# Patient Record
Sex: Male | Born: 1950 | Race: White | Hispanic: No | Marital: Married | Smoking: Current every day smoker
Health system: Southern US, Community
[De-identification: ages and names within clinical notes are randomized; demographics above are authoritative.]

## PROBLEM LIST (undated history)

## (undated) ENCOUNTER — Emergency Department (HOSPITAL_COMMUNITY): Payer: Medicare Other

## (undated) DIAGNOSIS — I10 Essential (primary) hypertension: Secondary | ICD-10-CM

## (undated) HISTORY — PX: BACK SURGERY: SHX140

## (undated) HISTORY — PX: JOINT REPLACEMENT: SHX530

## (undated) HISTORY — PX: NECK SURGERY: SHX720

---

## 2011-09-15 ENCOUNTER — Encounter (HOSPITAL_BASED_OUTPATIENT_CLINIC_OR_DEPARTMENT_OTHER): Payer: Self-pay | Admitting: Emergency Medicine

## 2011-09-15 ENCOUNTER — Emergency Department (INDEPENDENT_AMBULATORY_CARE_PROVIDER_SITE_OTHER): Payer: Self-pay

## 2011-09-15 ENCOUNTER — Emergency Department (HOSPITAL_BASED_OUTPATIENT_CLINIC_OR_DEPARTMENT_OTHER)
Admission: EM | Admit: 2011-09-15 | Discharge: 2011-09-16 | Disposition: A | Discharge status: Home / Self Care | Payer: Self-pay | Attending: Emergency Medicine | Admitting: Emergency Medicine

## 2011-09-15 DIAGNOSIS — J189 Pneumonia, unspecified organism: Secondary | ICD-10-CM | POA: Insufficient documentation

## 2011-09-15 DIAGNOSIS — R918 Other nonspecific abnormal finding of lung field: Secondary | ICD-10-CM

## 2011-09-15 DIAGNOSIS — J029 Acute pharyngitis, unspecified: Secondary | ICD-10-CM

## 2011-09-15 DIAGNOSIS — I10 Essential (primary) hypertension: Secondary | ICD-10-CM | POA: Insufficient documentation

## 2011-09-15 DIAGNOSIS — B370 Candidal stomatitis: Secondary | ICD-10-CM | POA: Insufficient documentation

## 2011-09-15 DIAGNOSIS — R05 Cough: Secondary | ICD-10-CM

## 2011-09-15 DIAGNOSIS — F17200 Nicotine dependence, unspecified, uncomplicated: Secondary | ICD-10-CM | POA: Insufficient documentation

## 2011-09-15 DIAGNOSIS — D3500 Benign neoplasm of unspecified adrenal gland: Secondary | ICD-10-CM

## 2011-09-15 HISTORY — DX: Essential (primary) hypertension: I10

## 2011-09-15 LAB — DIFFERENTIAL
Basophils Absolute: 0 10*3/uL (ref 0.0–0.1)
Lymphocytes Relative: 7 % — ABNORMAL LOW (ref 12–46)
Lymphs Abs: 1.3 10*3/uL (ref 0.7–4.0)
Monocytes Absolute: 1.4 10*3/uL — ABNORMAL HIGH (ref 0.1–1.0)
Monocytes Relative: 8 % (ref 3–12)
Neutro Abs: 15.6 10*3/uL — ABNORMAL HIGH (ref 1.7–7.7)

## 2011-09-15 LAB — COMPREHENSIVE METABOLIC PANEL
AST: 9 U/L (ref 0–37)
CO2: 28 mEq/L (ref 19–32)
Chloride: 99 mEq/L (ref 96–112)
Creatinine, Ser: 1.7 mg/dL — ABNORMAL HIGH (ref 0.50–1.35)
GFR calc non Af Amer: 42 mL/min — ABNORMAL LOW (ref >90)
Glucose, Bld: 100 mg/dL — ABNORMAL HIGH (ref 70–99)
Total Bilirubin: 0.3 mg/dL (ref 0.3–1.2)

## 2011-09-15 LAB — CBC
HCT: 43.1 % (ref 39.0–52.0)
Hemoglobin: 14.2 g/dL (ref 13.0–17.0)
RBC: 4.82 MIL/uL (ref 4.22–5.81)
RDW: 14.7 % (ref 11.5–15.5)
WBC: 18.4 10*3/uL — ABNORMAL HIGH (ref 4.0–10.5)

## 2011-09-15 LAB — RAPID STREP SCREEN (MED CTR MEBANE ONLY): Streptococcus, Group A Screen (Direct): NEGATIVE

## 2011-09-15 MED ORDER — DEXTROSE 5 % IV SOLN
500.0000 mg | Freq: Once | INTRAVENOUS | Status: AC
  Administered 2011-09-15: 500 mg via INTRAVENOUS
  Filled 2011-09-15: qty 500

## 2011-09-15 MED ORDER — MOXIFLOXACIN HCL 400 MG PO TABS: 400.0000 mg | ORAL_TABLET | Freq: Every day | ORAL | Status: DC

## 2011-09-15 MED ORDER — MOXIFLOXACIN HCL 400 MG PO TABS: 400.0000 mg | ORAL_TABLET | Freq: Every day | ORAL | Status: AC

## 2011-09-15 MED ORDER — FLUCONAZOLE 200 MG PO TABS: 200.0000 mg | ORAL_TABLET | Freq: Every day | ORAL | Status: DC

## 2011-09-15 MED ORDER — SODIUM CHLORIDE 0.9 % IV BOLUS (SEPSIS)
1000.0000 mL | Freq: Once | INTRAVENOUS | Status: AC
  Administered 2011-09-15: 1000 mL via INTRAVENOUS

## 2011-09-15 MED ORDER — IOHEXOL 300 MG/ML  SOLN
50.0000 mL | Freq: Once | INTRAMUSCULAR | Status: AC | PRN
  Administered 2011-09-15: 50 mL via INTRAVENOUS

## 2011-09-15 MED ORDER — SODIUM CHLORIDE 0.9 % IV SOLN
Freq: Once | INTRAVENOUS | Status: AC
  Administered 2011-09-15: 22:00:00 via INTRAVENOUS

## 2011-09-15 MED ORDER — SODIUM CHLORIDE 0.9 % IV SOLN
Freq: Once | INTRAVENOUS | Status: AC
  Administered 2011-09-15: 18:00:00 via INTRAVENOUS

## 2011-09-15 MED ORDER — DEXTROSE 5 % IV SOLN
1.0000 g | INTRAVENOUS | Status: DC
  Administered 2011-09-15: 1 g via INTRAVENOUS
  Filled 2011-09-15: qty 10

## 2011-09-15 MED ORDER — FLUCONAZOLE 200 MG PO TABS: 200.0000 mg | ORAL_TABLET | Freq: Every day | ORAL | Status: AC

## 2011-09-15 NOTE — ED Notes (Signed)
Patient transported to CT 

## 2011-09-15 NOTE — ED Provider Notes (Signed)
History     CSN: [id number]  Arrival date & time 09/15/11  1617   First MD Initiated Contact with Patient 09/15/11 1725      Chief Complaint  Patient presents with  . Sore Throat    (Consider location/radiation/quality/duration/timing/severity/associated sxs/prior treatment) Patient is a 61 y.o. male presenting with cough. The history is provided by the patient. No language interpreter was used.  Cough This is a new problem. The current episode started more than 1 week ago. The problem occurs constantly. The problem has been gradually worsening. The cough is productive of sputum. There has been no fever. The fever has been present for 3 to 4 days. Associated symptoms include chest pain and sore throat. He has tried nothing for the symptoms. The treatment provided no relief. He is a smoker. His past medical history does not include bronchitis or pneumonia.   Pt complains of a sore throat.  Pt reports his throat began feeling sore 3 days ago.  Pt is not eating or drinking.  Pt has had a cough for the past 2 weeks. Past Medical History  Diagnosis Date  . Hypertension     Past Surgical History  Procedure Date  . Back surgery   . Neck surgery   . Joint replacement     No family history on file.  History  Substance Use Topics  . Smoking status: Current Everyday Smoker  . Smokeless tobacco: Not on file  . Alcohol Use: No      Review of Systems  HENT: Positive for sore throat.   Respiratory: Positive for cough.   Cardiovascular: Positive for chest pain.  All other systems reviewed and are negative.    Allergies  Review of patient's allergies indicates no known allergies.  Home Medications   Current Outpatient Rx  Name Route Sig Dispense Refill  . ALPRAZOLAM 0.5 MG PO TABS Oral Take 0.5 mg by mouth 4 (four) times daily.    Marland Kitchen AMLODIPINE BESYLATE 10 MG PO TABS Oral Take 10 mg by mouth daily.    Marland Kitchen BENAZEPRIL HCL 40 MG PO TABS Oral Take 40 mg by mouth 2 (two) times  daily.    Marland Kitchen CITALOPRAM HYDROBROMIDE 40 MG PO TABS Oral Take 40 mg by mouth daily.    . IBUPROFEN 200 MG PO TABS Oral Take 600 mg by mouth every 6 (six) hours as needed. For pain    . METOPROLOL TARTRATE 100 MG PO TABS Oral Take 100 mg by mouth 2 (two) times daily.    . OXYCODONE HCL 15 MG PO TABS Oral Take 15 mg by mouth every 6 (six) hours as needed. For pain      BP 73/42  Pulse 74  Temp(Src) 98.2 F (36.8 C) (Oral)  Resp 20  Ht 6\' 3"  (1.905 m)  Wt 200 lb (90.719 kg)  BMI 25.00 kg/m2  SpO2 91%  Physical Exam  Nursing note and vitals reviewed. Constitutional: He is oriented to person, place, and time. He appears well-developed and well-nourished.  HENT:  Head: Normocephalic and atraumatic.  Right Ear: External ear normal.  Left Ear: External ear normal.  Nose: Nose normal.  Mouth/Throat: Oropharynx is clear and moist.  Eyes: Conjunctivae and EOM are normal. Pupils are equal, round, and reactive to light.  Neck: Normal range of motion. Neck supple.  Cardiovascular: Normal rate and normal heart sounds.   Pulmonary/Chest: Effort normal and breath sounds normal.  Abdominal: Soft. Bowel sounds are normal.  Musculoskeletal: Normal range of motion.  Neurological: He is alert and oriented to person, place, and time. He has normal reflexes.  Skin: Skin is warm.  Psychiatric: He has a normal mood and affect.    ED Course  Procedures (including critical care time)  Labs Reviewed - No data to display No results found.   No diagnosis found.    MDM   Results for orders placed during the hospital encounter of 09/15/11  RAPID STREP SCREEN      Component Value Range   Streptococcus, Group A Screen (Direct) NEGATIVE  NEGATIVE   CBC      Component Value Range   WBC 18.4 (*) 4.0 - 10.5 (K/uL)   RBC 4.82  4.22 - 5.81 (MIL/uL)   Hemoglobin 14.2  13.0 - 17.0 (g/dL)   HCT 16.1  09.6 - 04.5 (%)   MCV 89.4  78.0 - 100.0 (fL)   MCH 29.5  26.0 - 34.0 (pg)   MCHC 32.9  30.0 - 36.0  (g/dL)   RDW 40.9  81.1 - 91.4 (%)   Platelets 285  150 - 400 (K/uL)  DIFFERENTIAL      Component Value Range   Neutrophils Relative 85 (*) 43 - 77 (%)   Neutro Abs 15.6 (*) 1.7 - 7.7 (K/uL)   Lymphocytes Relative 7 (*) 12 - 46 (%)   Lymphs Abs 1.3  0.7 - 4.0 (K/uL)   Monocytes Relative 8  3 - 12 (%)   Monocytes Absolute 1.4 (*) 0.1 - 1.0 (K/uL)   Eosinophils Relative 0  0 - 5 (%)   Eosinophils Absolute 0.1  0.0 - 0.7 (K/uL)   Basophils Relative 0  0 - 1 (%)   Basophils Absolute 0.0  0.0 - 0.1 (K/uL)  COMPREHENSIVE METABOLIC PANEL      Component Value Range   Sodium 137  135 - 145 (mEq/L)   Potassium 4.7  3.5 - 5.1 (mEq/L)   Chloride 99  96 - 112 (mEq/L)   CO2 28  19 - 32 (mEq/L)   Glucose, Bld 100 (*) 70 - 99 (mg/dL)   BUN 30 (*) 6 - 23 (mg/dL)   Creatinine, Ser 7.82 (*) 0.50 - 1.35 (mg/dL)   Calcium 8.7  8.4 - 95.6 (mg/dL)   Total Protein 6.8  6.0 - 8.3 (g/dL)   Albumin 3.2 (*) 3.5 - 5.2 (g/dL)   AST 9  0 - 37 (U/L)   ALT 8  0 - 53 (U/L)   Alkaline Phosphatase 80  39 - 117 (U/L)   Total Bilirubin 0.3  0.3 - 1.2 (mg/dL)   GFR calc non Af Amer 42 (*) >90 (mL/min)   GFR calc Af Amer 49 (*) >90 (mL/min)  LACTIC ACID, PLASMA      Component Value Range   Lactic Acid, Venous 1.2  0.5 - 2.2 (mmol/L)   Dg Chest 2 View  09/15/2011  *RADIOLOGY REPORT*  Clinical Data: a cough and sore throat.  CHEST - 2 VIEW  Comparison: None  Findings: The heart is normal in size.  There are bronchitic type lung changes which could be related to smoking or bronchitis. Prominent aortic pulmonary window.  Cannot exclude lymphadenopathy. Small pulmonary nodules are suspected.  Chest CT with contrast is recommended for further evaluation.  Streaky basilar atelectasis. No pleural effusion.  Moderate degenerative changes in the thoracic spine.  IMPRESSION:  1.  Bronchitic type lung changes which could be due to smoking or bronchitis. 2.  Well descend the aortic pulmonary window.  Cannot exclude  adenopathy. 3.   Possible small pulmonary nodules. 4.  Recommend chest CT with contrast for further evaluation.  Original Report Authenticated By: P. Loralie Champagne, M.D.   Ct Chest W Contrast  09/15/2011  *RADIOLOGY REPORT*  Clinical Data: Question of pulmonary nodules on chest x-ray.  CT CHEST WITH CONTRAST  Technique:  Multidetector CT imaging of the chest was performed following the standard protocol during bolus administration of intravenous contrast.  Contrast: 50mL OMNIPAQUE IOHEXOL 300 MG/ML IJ SOLN  Comparison: Chest x-ray 80 09/15/2011.  Findings: The chest wall is unremarkable.  No supraclavicular or axillary lymphadenopathy.  The bony thorax is intact.  No destructive bony lesions or spinal canal compromise.  There are moderate degenerative changes.  The heart is normal in size.  No pericardial effusion.  The aorta is normal in caliber.  No dissection.  There are borderline enlarged mediastinal and hilar lymph nodes.  The esophagus is grossly normal.  Examination of the lung parenchyma demonstrates emphysematous changes.  No discrete pulmonary nodules or mass lesions.  There is interstitial thickening and patchy ground-glass opacity in both lower lobes which may suggest a partial airspace filling process such as early infiltrate or alveolitis.  There is a mild peribronchial inflammation are noted also.  A few small calcified granulomas are noted.  No pleural effusion or pulmonary edema.  The upper abdomen is unremarkable except for a right adrenal gland adenoma.  IMPRESSION:  1.  Probable early bronchopneumonia in both lower lobes. 2.  Borderline enlarged mediastinal and hilar lymph nodes.  I would recommend a follow-up chest CT in 3-4 months to reevaluate. 3.  Small calcified granulomas. 4.  Emphysematous changes. 5.  Right adrenal gland adenoma.  Original Report Authenticated By: P. Loralie Champagne, M.D.     Pt given IV fluids x 2 liters.  Pt given rocephin and zithromax.   I spoke to Dr. Selena Batten at [facility] on call for [facility].  He will admit for inpatient treatment.   Dr. Lynelle Doctor in to see and examine pt.      Lonia Skinner [place], [place] 09/15/11 2140

## 2011-09-15 NOTE — ED Notes (Signed)
Admission process explained to pt. Pt states he wants to go home- EDP J Knapp at bedside to explains risks of leaving ama

## 2011-09-15 NOTE — ED Notes (Addendum)
Sore throat since Thursday. "I think I have strep" Hurts to breathe BBS decreased. Hypotensive at triage. Sats 90-91%

## 2011-09-15 NOTE — ED Notes (Signed)
Rocephin infusing. Pt and wife discussing admission at this time

## 2011-09-15 NOTE — ED Provider Notes (Deleted)
Medical screening examination/treatment/procedure(s) were performed by non-physician practitioner and as supervising physician I was immediately available for consultation/collaboration.   Celene Kras, MD 09/15/11 [phone]

## 2011-09-15 NOTE — ED Notes (Addendum)
Pt refused admission after extensive discussion with Dr Lynelle Doctor- agreed to stay for zithromax dose- refused ekg

## 2011-09-15 NOTE — Discharge Instructions (Signed)
Pneumonia, Adult Pneumonia is an infection of the lungs. It may be caused by a germ (virus or bacteria). Some types of pneumonia can spread easily from person to person. This can happen when you cough or sneeze. HOME CARE  Only take medicine as told by your doctor.   Take your medicine (antibiotics) as told. Finish it even if you start to feel better.   Do not smoke.   You may use a vaporizer or humidifier in your room. This can help loosen thick spit (mucus).   Sleep so you are almost sitting up (semi-upright). This helps reduce coughing.   Rest.  A shot (vaccine) can help prevent pneumonia. Shots are often advised for:  People over 52 years old.   Patients on chemotherapy.   People with long-term (chronic) lung problems.   People with immune system problems.  GET HELP RIGHT AWAY IF:   You are getting worse.   You cannot control your cough, and you are losing sleep.   You cough up blood.   Your pain gets worse, even with medicine.   You have a fever.   Any of your problems are getting worse, not better.   You have shortness of breath or chest pain.  MAKE SURE YOU:   Understand these instructions.   Will watch your condition.   Will get help right away if you are not doing well or get worse.  Document Released: 11/20/2007 Document Revised: 05/23/2011 Document Reviewed: 08/24/2010 [facility] Patient Information 2012 [place], [place].Candida Infection, Adult A candida infection (also called yeast, fungus and Monilia infection) is an overgrowth of yeast that can occur anywhere on the body. A yeast infection commonly occurs in warm, moist body areas. Usually, the infection remains localized but can spread to become a systemic infection. A yeast infection may be a sign of a more severe disease such as diabetes, leukemia, or AIDS. A yeast infection can occur in both men and women. In women, Candida vaginitis is a vaginal infection. It is one of the most common causes of  vaginitis. Men usually do not have symptoms or know they have an infection until other problems develop. Men may find out they have a yeast infection because their sex partner has a yeast infection. Uncircumcised men are more likely to get a yeast infection than circumcised men. This is because the uncircumcised glans is not exposed to air and does not remain as dry as that of a circumcised glans. Older adults may develop yeast infections around dentures. CAUSES  Women  Antibiotics.   Steroid medication taken for a long time.   Being overweight (obese).   Diabetes.   Poor immune condition.   Certain serious medical conditions.   Immune suppressive medications for organ transplant patients.   Chemotherapy.   Pregnancy.   Menstration.   Stress and fatigue.   Intravenous drug use.   Oral contraceptives.   Wearing tight-fitting clothes in the crotch area.   Catching it from a sex partner who has a yeast infection.   Spermicide.   Intravenous, urinary, or other catheters.  Men  Catching it from a sex partner who has a yeast infection.   Having oral or anal sex with a person who has the infection.   Spermicide.   Diabetes.   Antibiotics.   Poor immune system.   Medications that suppress the immune system.   Intravenous drug use.   Intravenous, urinary, or other catheters.  SYMPTOMS  Women  Thick, white vaginal discharge.  Vaginal itching.   Redness and swelling in and around the vagina.   Irritation of the lips of the vagina and perineum.   Blisters on the vaginal lips and perineum.   Painful sexual intercourse.   Low blood sugar (hypoglycemia).   Painful urination.   Bladder infections.   Intestinal problems such as constipation, indigestion, bad breath, bloating, increase in gas, diarrhea, or loose stools.  Men  Men may develop intestinal problems such as constipation, indigestion, bad breath, bloating, increase in gas, diarrhea, or loose  stools.   Dry, cracked skin on the penis with itching or discomfort.   Jock itch.   Dry, flaky skin.   Athlete's foot.   Hypoglycemia.  DIAGNOSIS  Women  A history and an exam are performed.   The discharge may be examined under a microscope.   A culture may be taken of the discharge.  Men  A history and an exam are performed.   Any discharge from the penis or areas of cracked skin will be looked at under the microscope and cultured.   Stool samples may be cultured.  TREATMENT  Women  Vaginal antifungal suppositories and creams.   Medicated creams to decrease irritation and itching on the outside of the vagina.   Warm compresses to the perineal area to decrease swelling and discomfort.   Oral antifungal medications.   Medicated vaginal suppositories or cream for repeated or recurrent infections.   Wash and dry the irritation areas before applying the cream.   Eating yogurt with lactobacillus may help with prevention and treatment.   Sometimes painting the vagina with gentian violet solution may help if creams and suppositories do not work.  Men  Antifungal creams and oral antifungal medications.   Sometimes treatment must continue for 30 days after the symptoms go away to prevent recurrence.  HOME CARE INSTRUCTIONS  Women  Use cotton underwear and avoid tight-fitting clothing.   Avoid colored, scented toilet paper and deodorant tampons or pads.   Do not douche.   Keep your diabetes under control.   Finish all the prescribed medications.   Keep your skin clean and dry.   Consume milk or yogurt with lactobacillus active culture regularly. If you get frequent yeast infections and think that is what the infection is, there are over-the-counter medications that you can get. If the infection does not show healing in 3 days, talk to your caregiver.   Tell your sex partner you have a yeast infection. Your partner may need treatment also, especially if your  infection does not clear up or recurs.  Men  Keep your skin clean and dry.   Keep your diabetes under control.   Finish all prescribed medications.   Tell your sex partner that you have a yeast infection so they can be treated if necessary.  SEEK MEDICAL CARE IF:   Your symptoms do not clear up or worsen in one week after treatment.   You have an oral temperature above 102 F (38.9 C).   You have trouble swallowing or eating for a prolonged time.   You develop blisters on and around your vagina.   You develop vaginal bleeding and it is not your menstrual period.   You develop abdominal pain.   You develop intestinal problems as mentioned above.   You get weak or lightheaded.   You have painful or increased urination.   You have pain during sexual intercourse.  MAKE SURE YOU:   Understand these instructions.   Will  watch your condition.   Will get help right away if you are not doing well or get worse.  Document Released: 07/11/2004 Document Revised: 05/23/2011 Document Reviewed: 10/23/2009 [facility] Patient Information 2012 [place], [place].Pneumonia, Adult Pneumonia is an infection of the lungs. It may be caused by a germ (virus or bacteria). Some types of pneumonia can spread easily from person to person. This can happen when you cough or sneeze. HOME CARE  Only take medicine as told by your doctor.   Take your medicine (antibiotics) as told. Finish it even if you start to feel better.   Do not smoke.   You may use a vaporizer or humidifier in your room. This can help loosen thick spit (mucus).   Sleep so you are almost sitting up (semi-upright). This helps reduce coughing.   Rest.  A shot (vaccine) can help prevent pneumonia. Shots are often advised for:  People over 60 years old.   Patients on chemotherapy.   People with long-term (chronic) lung problems.   People with immune system problems.  GET HELP RIGHT AWAY IF:   You are getting worse.   You  cannot control your cough, and you are losing sleep.   You cough up blood.   Your pain gets worse, even with medicine.   You have a fever.   Any of your problems are getting worse, not better.   You have shortness of breath or chest pain.  MAKE SURE YOU:   Understand these instructions.   Will watch your condition.   Will get help right away if you are not doing well or get worse.  Document Released: 11/20/2007 Document Revised: 05/23/2011 Document Reviewed: 08/24/2010 [facility] Patient Information 2012 [place], [place].

## 2011-09-15 NOTE — ED Notes (Signed)
Pt's iv assessed- unable to flush- removed and restarted prior to CT

## 2011-09-15 NOTE — ED Provider Notes (Addendum)
The patient has been having trouble with cough and sore throat that has been ongoing for the last several days. He has felt somewhat lightheaded as well. He is not particularly felt short of breath. Patient does smoke but he denies any history of chronic lung issues.  On exam he is noted to be hypotensive and hypoxic .  He is alert does not appear to be in any distress.  Heart regular rate and rhythm. Lungs are clear to auscultation. Excess very dry with whitish plaques on the palm.  Chest x-ray does not show any pneumonia. It is possible this could be bronchitis but with the pulmonary nodules and enlarged mediastinum concerned about the possibility of lung cancer. His oropharynx exam is not suggestive of strep throat or an abscess. Question whether he could have a candida infection. We will add on lactate, prolactin, continue IV hydration and plan on obtaining a CT of the chest.  Medical screening examination/treatment/procedure(s) were conducted as a shared visit with non-physician practitioner(s) and myself.  I personally evaluated the patient during the encounter   Celene Kras, MD 09/15/11 1942  Pt now states that he wants to leave and not be admitted.  I explained to him that I am very worried about him going home.  His blood pressure and oxygen level are low. It is possible that he has an infection in the blood stream in addition to his pna that could be deadly if not treated properly.  Pt understands this.  He states he cannot afford hospitalization and he understands that he could get much worse even resulting in death.  Pt states he will think about it a little more.  Celene Kras, MD 09/15/11 [phone]

## 2011-09-16 LAB — PROLACTIN: Prolactin: 13.9 ng/mL (ref 2.1–17.1)

## 2011-09-16 NOTE — ED Notes (Signed)
Pt advised of risks of leaving AMA (including death) by EDP Linwood Dibbles and Langston Masker, EDPA. AMA form signed. Advised he can return at any time. Pt alert and ambulatory. States he will f/u with his primary care doctor in am. Rx x 2 for diflucan and avelox given. D/C with wife to drive.

## 2011-09-22 LAB — CULTURE, BLOOD (ROUTINE X 2)
Culture  Setup Time: 201304010215
Culture: NO GROWTH

## 2014-06-22 ENCOUNTER — Encounter (HOSPITAL_BASED_OUTPATIENT_CLINIC_OR_DEPARTMENT_OTHER): Payer: Self-pay | Admitting: *Deleted

## 2014-06-22 ENCOUNTER — Emergency Department (HOSPITAL_BASED_OUTPATIENT_CLINIC_OR_DEPARTMENT_OTHER)
Admission: EM | Admit: 2014-06-22 | Discharge: 2014-06-22 | Disposition: A | Discharge status: Home / Self Care | Payer: Medicare Other | Attending: Emergency Medicine | Admitting: Emergency Medicine

## 2014-06-22 DIAGNOSIS — T464X5A Adverse effect of angiotensin-converting-enzyme inhibitors, initial encounter: Secondary | ICD-10-CM | POA: Diagnosis not present

## 2014-06-22 DIAGNOSIS — Z720 Tobacco use: Secondary | ICD-10-CM | POA: Diagnosis not present

## 2014-06-22 DIAGNOSIS — Z79899 Other long term (current) drug therapy: Secondary | ICD-10-CM | POA: Diagnosis not present

## 2014-06-22 DIAGNOSIS — T783XXA Angioneurotic edema, initial encounter: Secondary | ICD-10-CM | POA: Insufficient documentation

## 2014-06-22 DIAGNOSIS — I10 Essential (primary) hypertension: Secondary | ICD-10-CM | POA: Insufficient documentation

## 2014-06-22 DIAGNOSIS — R220 Localized swelling, mass and lump, head: Secondary | ICD-10-CM | POA: Diagnosis present

## 2014-06-22 MED ORDER — ALPRAZOLAM 0.5 MG PO TABS
0.5000 mg | ORAL_TABLET | Freq: Once | ORAL | Status: AC
  Administered 2014-06-22: 0.5 mg via ORAL
  Filled 2014-06-22: qty 1

## 2014-06-22 MED ORDER — OXYCODONE HCL 5 MG PO TABS
15.0000 mg | ORAL_TABLET | Freq: Once | ORAL | Status: AC
Start: 2014-06-22 — End: 2014-06-22
  Administered 2014-06-22: 15 mg via ORAL
  Filled 2014-06-22: qty 3

## 2014-06-22 NOTE — ED Notes (Addendum)
Pt c/o tongue swelling x 6 hrs sent here from PMD office for eval pt denies resp distress

## 2014-06-22 NOTE — Discharge Instructions (Signed)
You are allergic to benazepril and all Ace Inhibitors. Please let your doctor and all future health care providers aware.    Angioedema Angioedema is a sudden swelling of tissues, often of the skin. It can occur on the face or genitals or in the abdomen or other body parts. The swelling usually develops over a short period and gets better in 24 to 48 hours. It often begins during the night and is found when the person wakes up. The person may also get red, itchy patches of skin (hives). Angioedema can be dangerous if it involves swelling of the air passages.  Depending on the cause, episodes of angioedema may only happen once, come back in unpredictable patterns, or repeat for several years and then gradually fade away.  CAUSES  Angioedema can be caused by an allergic reaction to various triggers. It can also result from nonallergic causes, including reactions to drugs, immune system disorders, viral infections, or an abnormal gene that is passed to you from your parents (hereditary). For some people with angioedema, the cause is unknown.  Some things that can trigger angioedema include:   Foods.   Medicines, such as ACE inhibitors, ARBs, nonsteroidal anti-inflammatory agents, or estrogen.   Latex.   Animal saliva.   Insect stings.   Dyes used in X-rays.   Mild injury.   Dental work.  Surgery.  Stress.   Sudden changes in temperature.   Exercise. SIGNS AND SYMPTOMS   Swelling of the skin.  Hives. If these are present, there is also intense itching.  Redness in the affected area.   Pain in the affected area.  Swollen lips or tongue.  Breathing problems. This may happen if the air passages swell.  Wheezing. If internal organs are involved, there may be:   Nausea.   Abdominal pain.   Vomiting.   Difficulty swallowing.   Difficulty passing urine. DIAGNOSIS   Your health care provider will examine the affected area and take a medical and family  history.  Various tests may be done to help determine the cause. Tests may include:  Allergy skin tests to see if the problem is an allergic reaction.   Blood tests to check for hereditary angioedema.   Tests to check for underlying diseases that could cause the condition.   A review of your medicines, including over-the-counter medicines, may be done. TREATMENT  Treatment will depend on the cause of the angioedema. Possible treatments include:   Removal of anything that triggered the condition (such as stopping certain medicines).   Medicines to treat symptoms or prevent attacks. Medicines given may include:   Antihistamines.   Epinephrine injection.   Steroids.   Hospitalization may be required for severe attacks. If the air passages are affected, it can be an emergency. Tubes may need to be placed to keep the airway open. HOME CARE INSTRUCTIONS   Take all medicines as directed by your health care provider.  If you were given medicines for emergency allergy treatment, always carry them with you.  Wear a medical bracelet as directed by your health care provider.   Avoid known triggers. SEEK MEDICAL CARE IF:   You have repeat attacks of angioedema.   Your attacks are more frequent or more severe despite preventive measures.   You have hereditary angioedema and are considering having children. It is important to discuss with your health care provider the risks of passing the condition on to your children. SEEK IMMEDIATE MEDICAL CARE IF:   You have severe  swelling of the mouth, tongue, or lips.  You have difficulty breathing.   You have difficulty swallowing.   You faint. MAKE SURE YOU:  Understand these instructions.  Will watch your condition.  Will get help right away if you are not doing well or get worse. Document Released: 08/12/2001 Document Revised: 10/18/2013 Document Reviewed: 01/25/2013 [facility] Patient Information 2015 [place], [place].  This information is not intended to replace advice given to you by your health care provider. Make sure you discuss any questions you have with your health care provider.

## 2014-06-22 NOTE — ED Provider Notes (Signed)
CSN: [id number]     Arrival date & time 06/22/14  1651 History   First MD Initiated Contact with Patient 06/22/14 1702     Chief Complaint  Patient presents with  . Oral Swelling     The history is provided by the patient. No language interpreter was used.   Mr. Ronald Coleman presents for evaluation of tongue swelling. He woke up at 11 AM and noted the right side of his tongue was swollen. She has family doctor's office referred to the emergency department for further evaluation. He denies any throat swelling or difficulty breathing. There is no chest pain or leg swelling. Symptoms are moderate, constant, slightly improving. Takes enalapril for hypertension as well as multiple other medications. He's never had this happen before.  Past Medical History  Diagnosis Date  . Hypertension    Past Surgical History  Procedure Laterality Date  . Back surgery    . Neck surgery    . Joint replacement     History reviewed. No pertinent family history. History  Substance Use Topics  . Smoking status: Current Every Day Smoker -- 1.00 packs/day    Types: Cigarettes  . Smokeless tobacco: Not on file  . Alcohol Use: No    Review of Systems  All other systems reviewed and are negative.     Allergies  Ace inhibitors  Home Medications   Prior to Admission medications   Medication Sig Start Date End Date Taking? Authorizing Provider  ALPRAZolam Duanne Moron) 0.5 MG tablet Take 0.5 mg by mouth 4 (four) times daily.    Historical Provider, MD  amLODipine (NORVASC) 10 MG tablet Take 10 mg by mouth daily.    Historical Provider, MD  benazepril (LOTENSIN) 40 MG tablet Take 40 mg by mouth 2 (two) times daily.    Historical Provider, MD  citalopram (CELEXA) 40 MG tablet Take 40 mg by mouth daily.    Historical Provider, MD  ibuprofen (ADVIL,MOTRIN) 200 MG tablet Take 600 mg by mouth every 6 (six) hours as needed. For pain    Historical Provider, MD  metoprolol (LOPRESSOR) 100 MG tablet Take 100 mg by mouth 2  (two) times daily.    Historical Provider, MD  oxyCODONE (ROXICODONE) 15 MG immediate release tablet Take 15 mg by mouth every 6 (six) hours as needed. For pain    Historical Provider, MD   BP 135/79 mmHg  Pulse 68  Temp(Src) 97.8 F (36.6 C) (Oral)  Resp 16  Ht 6\' 3"  (1.905 m)  Wt 208 lb (94.348 kg)  BMI 26.00 kg/m2  SpO2 98% Physical Exam  Constitutional: He is oriented to person, place, and time. He appears well-developed and well-nourished.  HENT:  Head: Normocephalic and atraumatic.  Mild to moderate swelling of the right side of the tongue, there is no swelling of the posterior oropharynx.  Cardiovascular: Normal rate and regular rhythm.   No murmur heard. Pulmonary/Chest: Effort normal and breath sounds normal. No respiratory distress.  No stridor  Abdominal: Soft. There is no tenderness. There is no rebound and no guarding.  Musculoskeletal: He exhibits no edema or tenderness.  Neurological: He is alert and oriented to person, place, and time.  Skin: Skin is warm and dry.  Psychiatric: He has a normal mood and affect. His behavior is normal.  Nursing note and vitals reviewed.   ED Course  Procedures (including critical care time) Labs Review Labs Reviewed - No data to display  Imaging Review No results found.   EKG Interpretation None  MDM   Final diagnoses:  ACE inhibitor-aggravated angioedema, initial encounter    Patient here for evaluation of right tongue swelling, takes benazepril for hypertension. Exam is consistent with ACE inhibitor-induced angioedema. Patient is without respiratory distress at this time. Plan to monitor in the emergency department. Patient is due for his home chronic pain medicine and anxiety medicine, providing doses in the emergency department.  On recheck in the emergency department patient is feeling improved with decrease in his tongue swelling. Discussed with patient return precautions and need to stop taking his ACE  inhibitor.  Quintella Reichert, MD 06/23/14 0100

## 2016-03-23 ENCOUNTER — Emergency Department (HOSPITAL_BASED_OUTPATIENT_CLINIC_OR_DEPARTMENT_OTHER): Payer: Medicare Other

## 2016-03-23 ENCOUNTER — Emergency Department (HOSPITAL_BASED_OUTPATIENT_CLINIC_OR_DEPARTMENT_OTHER)
Admission: EM | Admit: 2016-03-23 | Discharge: 2016-03-24 | Disposition: A | Discharge status: Other Acute Inpatient Hospital | Payer: Medicare Other | Attending: Emergency Medicine | Admitting: Emergency Medicine

## 2016-03-23 ENCOUNTER — Encounter (HOSPITAL_BASED_OUTPATIENT_CLINIC_OR_DEPARTMENT_OTHER): Payer: Self-pay | Admitting: Emergency Medicine

## 2016-03-23 DIAGNOSIS — R42 Dizziness and giddiness: Secondary | ICD-10-CM | POA: Insufficient documentation

## 2016-03-23 DIAGNOSIS — R0602 Shortness of breath: Secondary | ICD-10-CM | POA: Diagnosis present

## 2016-03-23 DIAGNOSIS — I10 Essential (primary) hypertension: Secondary | ICD-10-CM | POA: Diagnosis not present

## 2016-03-23 DIAGNOSIS — J441 Chronic obstructive pulmonary disease with (acute) exacerbation: Secondary | ICD-10-CM | POA: Diagnosis not present

## 2016-03-23 DIAGNOSIS — R0902 Hypoxemia: Secondary | ICD-10-CM

## 2016-03-23 DIAGNOSIS — R2689 Other abnormalities of gait and mobility: Secondary | ICD-10-CM | POA: Insufficient documentation

## 2016-03-23 DIAGNOSIS — Z79899 Other long term (current) drug therapy: Secondary | ICD-10-CM | POA: Diagnosis not present

## 2016-03-23 DIAGNOSIS — F17210 Nicotine dependence, cigarettes, uncomplicated: Secondary | ICD-10-CM | POA: Insufficient documentation

## 2016-03-23 LAB — I-STAT ARTERIAL BLOOD GAS, ED
ACID-BASE EXCESS: 3 mmol/L — AB (ref 0.0–2.0)
Acid-Base Excess: 3 mmol/L — ABNORMAL HIGH (ref 0.0–2.0)
BICARBONATE: 31.2 mmol/L — AB (ref 20.0–28.0)
Bicarbonate: 30.9 mmol/L — ABNORMAL HIGH (ref 20.0–28.0)
O2 Saturation: 75 %
O2 Saturation: 89 %
PCO2 ART: 64 mmHg — AB (ref 32.0–48.0)
PCO2 ART: 63.6 mmHg — AB (ref 32.0–48.0)
PH ART: 7.297 — AB (ref 7.350–7.450)
PO2 ART: 66 mmHg — AB (ref 83.0–108.0)
Patient temperature: 99.6
TCO2: 33 mmol/L (ref 0–100)
TCO2: 33 mmol/L (ref 0–100)
pH, Arterial: 7.299 — ABNORMAL LOW (ref 7.350–7.450)
pO2, Arterial: 47 mmHg — ABNORMAL LOW (ref 83.0–108.0)

## 2016-03-23 LAB — COMPREHENSIVE METABOLIC PANEL
ALBUMIN: 3.6 g/dL (ref 3.5–5.0)
ALT: 22 U/L (ref 17–63)
AST: 22 U/L (ref 15–41)
Alkaline Phosphatase: 111 U/L (ref 38–126)
Anion gap: 7 (ref 5–15)
BUN: 15 mg/dL (ref 6–20)
CHLORIDE: 99 mmol/L — AB (ref 101–111)
CO2: 30 mmol/L (ref 22–32)
CREATININE: 0.96 mg/dL (ref 0.61–1.24)
Calcium: 8.3 mg/dL — ABNORMAL LOW (ref 8.9–10.3)
GFR calc Af Amer: >60 mL/min (ref >60)
GFR calc non Af Amer: >60 mL/min (ref >60)
Glucose, Bld: 149 mg/dL — ABNORMAL HIGH (ref 65–99)
POTASSIUM: 4.2 mmol/L (ref 3.5–5.1)
SODIUM: 136 mmol/L (ref 135–145)
Total Bilirubin: 0.3 mg/dL (ref 0.3–1.2)
Total Protein: 7 g/dL (ref 6.5–8.1)

## 2016-03-23 LAB — I-STAT CG4 LACTIC ACID, ED: Lactic Acid, Venous: 1.49 mmol/L (ref 0.5–1.9)

## 2016-03-23 LAB — CBC WITH DIFFERENTIAL/PLATELET
BASOS ABS: 0 10*3/uL (ref 0.0–0.1)
BASOS PCT: 0 %
Eosinophils Absolute: 0 10*3/uL (ref 0.0–0.7)
Eosinophils Relative: 0 %
HCT: 49.1 % (ref 39.0–52.0)
Hemoglobin: 15.2 g/dL (ref 13.0–17.0)
LYMPHS PCT: 19 %
Lymphs Abs: 2.5 10*3/uL (ref 0.7–4.0)
MCH: 29.5 pg (ref 26.0–34.0)
MCHC: 31 g/dL (ref 30.0–36.0)
MCV: 95.2 fL (ref 78.0–100.0)
Monocytes Absolute: 0.9 10*3/uL (ref 0.1–1.0)
Monocytes Relative: 7 %
Neutro Abs: 9.7 10*3/uL — ABNORMAL HIGH (ref 1.7–7.7)
Neutrophils Relative %: 74 %
Platelets: 281 10*3/uL (ref 150–400)
RBC: 5.16 MIL/uL (ref 4.22–5.81)
RDW: 16.4 % — ABNORMAL HIGH (ref 11.5–15.5)
WBC: 13.2 10*3/uL — AB (ref 4.0–10.5)

## 2016-03-23 LAB — BRAIN NATRIURETIC PEPTIDE: B Natriuretic Peptide: 451.1 pg/mL — ABNORMAL HIGH (ref 0.0–100.0)

## 2016-03-23 LAB — TROPONIN I: Troponin I: <0.03 ng/mL (ref <0.03)

## 2016-03-23 LAB — D-DIMER, QUANTITATIVE: D-Dimer, Quant: 0.33 ug/mL-FEU (ref 0.00–0.50)

## 2016-03-23 MED ORDER — ALBUTEROL SULFATE (2.5 MG/3ML) 0.083% IN NEBU
INHALATION_SOLUTION | RESPIRATORY_TRACT | Status: AC
  Administered 2016-03-23: 5 mg
  Filled 2016-03-23: qty 6

## 2016-03-23 MED ORDER — IPRATROPIUM BROMIDE 0.02 % IN SOLN
0.5000 mg | Freq: Once | RESPIRATORY_TRACT | Status: AC
  Administered 2016-03-23: 0.5 mg via RESPIRATORY_TRACT
  Filled 2016-03-23: qty 2.5

## 2016-03-23 MED ORDER — ACETAMINOPHEN 325 MG PO TABS
650.0000 mg | ORAL_TABLET | Freq: Once | ORAL | Status: DC
  Filled 2016-03-23: qty 2

## 2016-03-23 MED ORDER — ALBUTEROL SULFATE (2.5 MG/3ML) 0.083% IN NEBU
5.0000 mg | INHALATION_SOLUTION | Freq: Once | RESPIRATORY_TRACT | Status: AC
  Administered 2016-03-23: 5 mg via RESPIRATORY_TRACT
  Filled 2016-03-23: qty 6

## 2016-03-23 MED ORDER — METHYLPREDNISOLONE SODIUM SUCC 125 MG IJ SOLR
125.0000 mg | Freq: Once | INTRAMUSCULAR | Status: AC
  Administered 2016-03-23: 125 mg via INTRAVENOUS
  Filled 2016-03-23: qty 2

## 2016-03-23 MED ORDER — IPRATROPIUM BROMIDE 0.02 % IN SOLN
RESPIRATORY_TRACT | Status: AC
  Administered 2016-03-23: 0.5 mg
  Filled 2016-03-23: qty 2.5

## 2016-03-23 MED ORDER — MAGNESIUM SULFATE 2 GM/50ML IV SOLN
2.0000 g | Freq: Once | INTRAVENOUS | Status: DC
Start: 2016-03-23 — End: 2016-03-23
  Filled 2016-03-23: qty 50

## 2016-03-23 MED ORDER — SODIUM CHLORIDE 0.9 % IV SOLN
INTRAVENOUS | Status: DC
  Administered 2016-03-24: 02:00:00 via INTRAVENOUS

## 2016-03-23 MED ORDER — SODIUM CHLORIDE 0.9 % IV SOLN: 1000.0000 mL | INTRAVENOUS | Status: DC

## 2016-03-23 MED ORDER — LEVOFLOXACIN IN D5W 750 MG/150ML IV SOLN
750.0000 mg | Freq: Once | INTRAVENOUS | Status: AC
  Administered 2016-03-23: 750 mg via INTRAVENOUS
  Filled 2016-03-23: qty 150

## 2016-03-23 NOTE — ED Provider Notes (Signed)
[facility] [facility] Provider Note   CSN: [id number] Arrival date & time: 03/23/16  2146   By signing my name below, I, Neta Mends, attest that this documentation has been prepared under the direction and in the presence of Blanchie Dessert, MD . Electronically Signed: Neta Mends, ED Scribe. 03/23/2016. 10:02 PM.   History   Chief Complaint Chief Complaint  Patient presents with  . Chest Pain  . Shortness of Breath    The history is provided by the patient. No language interpreter was used.   HPI Comments:  Yoscar Alberts is a 65 y.o. male with PMHx of pneumonia, COPD, current tobacco abuse, recently started methadone clinic who presents to the Emergency Department complaining of sudden onset, constant dizziness that began ~4pm today. Pt reports associated lightheadedness, chest pain x1 week, SOB, difficulty walking, cough, nausea which worsened today. Pt's wife states that pt was having difficulty walking and some confusion today. Per wife, pt's general condition has been worsing over the past week. Pt is in a methadone treatment for 1 week and his treatment dose was recently increased yesterday to 50mg  and he did sleep most of the day today. No alleviating factors noted. Pt denies other associated symptoms.   NO abd pain, vomiting or diarrhea.  No unilateral leg pain or swelling.  Past Medical History:  Diagnosis Date  . Hypertension     There are no active problems to display for this patient.   Past Surgical History:  Procedure Laterality Date  . BACK SURGERY    . JOINT REPLACEMENT    . NECK SURGERY         Home Medications    Prior to Admission medications   Medication Sig Start Date End Date Taking? Authorizing Provider  ALPRAZolam Duanne Moron) 0.5 MG tablet Take 0.5 mg by mouth 4 (four) times daily.    Historical Provider, MD  amLODipine (NORVASC) 10 MG tablet Take 10 mg by mouth daily.    Historical Provider, MD  benazepril (LOTENSIN) 40 MG tablet  Take 40 mg by mouth 2 (two) times daily.    Historical Provider, MD  citalopram (CELEXA) 40 MG tablet Take 40 mg by mouth daily.    Historical Provider, MD  ibuprofen (ADVIL,MOTRIN) 200 MG tablet Take 600 mg by mouth every 6 (six) hours as needed. For pain    Historical Provider, MD  metoprolol (LOPRESSOR) 100 MG tablet Take 100 mg by mouth 2 (two) times daily.    Historical Provider, MD  oxyCODONE (ROXICODONE) 15 MG immediate release tablet Take 15 mg by mouth every 6 (six) hours as needed. For pain    Historical Provider, MD    Family History History reviewed. No pertinent family history.  Social History Social History  Substance Use Topics  . Smoking status: Current Every Day Smoker    Packs/day: 1.00    Types: Cigarettes  . Smokeless tobacco: Not on file  . Alcohol use No     Allergies   Ace inhibitors   Review of Systems Review of Systems  Respiratory: Positive for cough and shortness of breath.   Cardiovascular: Positive for chest pain.  Gastrointestinal: Positive for nausea.  Musculoskeletal: Positive for gait problem.  Neurological: Positive for dizziness and light-headedness.  All other systems reviewed and are negative.    Physical Exam Updated Vital Signs BP 135/71   Pulse 104   Temp 99.5 F (37.5 C)   Resp 22   SpO2 (S) (!) 43%   Physical Exam  Constitutional: He is oriented to person, place, and time. He appears well-developed and well-nourished. He appears distressed.  HENT:  Head: Normocephalic and atraumatic.  Mouth/Throat: Mucous membranes are dry and cyanotic.  mottled and cyannotic around lips and fingers  Eyes: Conjunctivae are normal.  Neck: Normal range of motion. Neck supple.  Cardiovascular: Regular rhythm.  Tachycardia present.   Tachycardic   Pulmonary/Chest: Effort normal. He has decreased breath sounds. He has wheezes.  tachypnic; diffuse whezzing with decreased brweth sounds   Abdominal: He exhibits no distension. There is no  tenderness.  Musculoskeletal: He exhibits edema. He exhibits no tenderness.   trace edema in bilateral lower extremities  Neurological: He is alert and oriented to person, place, and time. He has normal strength. No sensory deficit.  Skin: Skin is warm and dry.  clubbing of the nails on both hands  Psychiatric: He has a normal mood and affect.  Nursing note and vitals reviewed.    ED Treatments / Results  DIAGNOSTIC STUDIES:  Oxygen Saturation is 84% on NCO2, low by my interpretation.    COORDINATION OF CARE:  10:02 PM Discussed treatment plan with pt at bedside and pt agreed to plan.   Labs (all labs ordered are listed, but only abnormal results are displayed) Labs Reviewed  CBC WITH DIFFERENTIAL/PLATELET - Abnormal; Notable for the following:       Result Value   WBC 13.2 (*)    RDW 16.4 (*)    Neutro Abs 9.7 (*)    All other components within normal limits  COMPREHENSIVE METABOLIC PANEL - Abnormal; Notable for the following:    Chloride 99 (*)    Glucose, Bld 149 (*)    Calcium 8.3 (*)    All other components within normal limits  BRAIN NATRIURETIC PEPTIDE - Abnormal; Notable for the following:    B Natriuretic Peptide 451.1 (*)    All other components within normal limits  I-STAT ARTERIAL BLOOD GAS, ED - Abnormal; Notable for the following:    pH, Arterial 7.299 (*)    pCO2 arterial 64.0 (*)    pO2, Arterial 47.0 (*)    Bicarbonate 31.2 (*)    Acid-Base Excess 3.0 (*)    All other components within normal limits  CULTURE, BLOOD (ROUTINE X 2)  CULTURE, BLOOD (ROUTINE X 2)  URINE CULTURE  D-DIMER, QUANTITATIVE (NOT AT [facility])  TROPONIN I  URINALYSIS, ROUTINE W REFLEX MICROSCOPIC (NOT AT [facility])  I-STAT CG4 LACTIC ACID, ED    EKG  EKG Interpretation  Date/Time:  Saturday March 23 2016 21:55:07 EDT Ventricular Rate:  95 PR Interval:    QRS Duration: 86 QT Interval:  361 QTC Calculation: 454 R Axis:   52 Text Interpretation:  Sinus rhythm Borderline  prolonged PR interval Probable left atrial enlargement No previous tracing Confirmed by Maryan Rued  MD, Loree Fee ([id number]) on 03/23/2016 10:10:17 PM       Radiology Dg Chest Port 1 View  Result Date: 03/23/2016 CLINICAL DATA:  Cough for 1 month. Acute difficulty breathing since 1400 hours today. No fever. Smoker. EXAM: PORTABLE CHEST 1 VIEW COMPARISON:  09/15/2011 FINDINGS: Shallow inspiration. Borderline heart size with increased pulmonary vascularity. No edema or consolidation. No blunting of costophrenic angles. No pneumothorax. Peribronchial thickening suggesting airways disease. Mediastinal contours appear intact. Postoperative changes in the lower cervical spine. IMPRESSION: Congestive changes with cardiac enlargement and pulmonary vascular congestion. No definite edema or effusion. Airways thickening suggesting airways disease. Electronically Signed   By: Oren Beckmann.D.  On: 03/23/2016 22:21    Procedures Procedures (including critical care time)  Medications Ordered in ED Medications  acetaminophen (TYLENOL) tablet 650 mg (not administered)  albuterol (PROVENTIL) (2.5 MG/3ML) 0.083% nebulizer solution (5 mg  Given 03/23/16 2159)  ipratropium (ATROVENT) 0.02 % nebulizer solution (0.5 mg  Given 03/23/16 2200)  methylPREDNISolone sodium succinate (SOLU-MEDROL) 125 mg/2 mL injection 125 mg (125 mg Intravenous Given 03/23/16 2220)     Initial Impression / Assessment and Plan / ED Course  I have reviewed the triage vital signs and the nursing notes.  Pertinent labs & imaging results that were available during my care of the patient were reviewed by me and considered in my medical decision making (see chart for details).  Clinical Course   Patient is a 65 year old male with history of COPD who is a current smoker presenting today with maybe a one-week history of worsening symptoms. Seemed to get much worse around 4 PM today. Patient complaining of dizziness as well. He has had a cough  which is been intermittently productive but has also felt feverish today. Upon arrival here patient was cyanotic, pale, tachycardic, hypotensive with a temperature of 99.5 orally. He was mentating at this time however he was placed on 6 L nasal cannula with improvement in oxygen saturation to the 80s. Color improved. Patient is wheezing diffusely with decreased breath sounds. He does not wear oxygen at home but does use breathing treatments.  Patient's EKG is within normal limits without signs of STEMI. Concern for COPD exacerbation versus PE versus pneumonia versus N STEMI. Lower suspicion for dissection. Given patient's initial vital signs and potential for infection patient meds code sepsis criteria in order set was initiated. Currently blood pressure is been stable. Patient given Solu-Medrol, magnesium, albuterol and Atrovent. ABG, CMP, lactate, troponin, d-dimer, chest x-ray, urine pending. CBC with leukocytosis of 13,000.  10:39 PM Wheezing improved after albuterol and Atrovent. Patient's oxygen saturation still approximately 85%. ABG with mild respiratory acidosis but O2 of 46. D-dimer is negative, chest x-ray with concern for potential pulmonary edema without focal infiltrate. Troponin and BNP are pending. Patient was started on BiPAP however voicing that he feels much better.  11:08 PM Trop neg and BNP borderline at 450.  Pt BP 106/76 and O2 sats in the mid to low 90's.  At this time now definitive CHF and no definitive infection.  May be COPD exacerbation.  Will admit to [facility] to stepdown.  Will cover with levaquin  CRITICAL CARE Performed by: Blanchie Dessert Total critical care time: 45 minutes Critical care time was exclusive of separately billable procedures and treating other patients. Critical care was necessary to treat or prevent imminent or life-threatening deterioration. Critical care was time spent personally by me on the following activities: development of treatment plan with  patient and/or surrogate as well as nursing, discussions with consultants, evaluation of patient's response to treatment, examination of patient, obtaining history from patient or surrogate, ordering and performing treatments and interventions, ordering and review of laboratory studies, ordering and review of radiographic studies, pulse oximetry and re-evaluation of patient's condition.  Final Clinical Impressions(s) / ED Diagnoses   Final diagnoses:  COPD exacerbation ([place])  Hypoxia    New Prescriptions New Prescriptions   No medications on file   I personally performed the services described in this documentation, which was scribed in my presence.  The recorded information has been reviewed and considered.     Blanchie Dessert, MD 03/23/16 [phone]

## 2016-03-23 NOTE — ED Notes (Signed)
Patient looks "better" the grey look on admission is gone and patient is a little more "pink" in color. The patient is on O2 sats maintained at 36 - 87 percents. BC x 2 sent and blood work completed. Per MD order Magnesium held

## 2016-03-23 NOTE — ED Notes (Signed)
Pt sat up to 91 on 7 L [place]

## 2016-03-23 NOTE — ED Triage Notes (Addendum)
Pt in c/o intermittent chest pain x 1 week. SOB onset today. Also dizzy and lightheaded. Pt is alert, interactive, ambulatory in NAD but appears pale and dusky. Dr. Maryan Rued at bedside.

## 2016-03-24 DIAGNOSIS — I10 Essential (primary) hypertension: Secondary | ICD-10-CM | POA: Diagnosis not present

## 2016-03-24 DIAGNOSIS — R0602 Shortness of breath: Secondary | ICD-10-CM | POA: Diagnosis present

## 2016-03-24 DIAGNOSIS — F17210 Nicotine dependence, cigarettes, uncomplicated: Secondary | ICD-10-CM | POA: Diagnosis not present

## 2016-03-24 DIAGNOSIS — R42 Dizziness and giddiness: Secondary | ICD-10-CM | POA: Diagnosis not present

## 2016-03-24 DIAGNOSIS — R2689 Other abnormalities of gait and mobility: Secondary | ICD-10-CM | POA: Diagnosis not present

## 2016-03-24 DIAGNOSIS — J441 Chronic obstructive pulmonary disease with (acute) exacerbation: Secondary | ICD-10-CM | POA: Diagnosis not present

## 2016-03-24 DIAGNOSIS — Z79899 Other long term (current) drug therapy: Secondary | ICD-10-CM | POA: Diagnosis not present

## 2016-03-29 LAB — CULTURE, BLOOD (ROUTINE X 2)
CULTURE: NO GROWTH
Culture: NO GROWTH

## 2017-03-24 IMAGING — DX DG CHEST 1V PORT
1 series · 1 of 1 positions shown · non-contrast
Comparison: 09/15/2011

CLINICAL DATA: Cough for 1 month. Acute difficulty breathing since
0466 hours today. No fever. Smoker.

EXAM:
PORTABLE CHEST 1 VIEW

[chest ap]
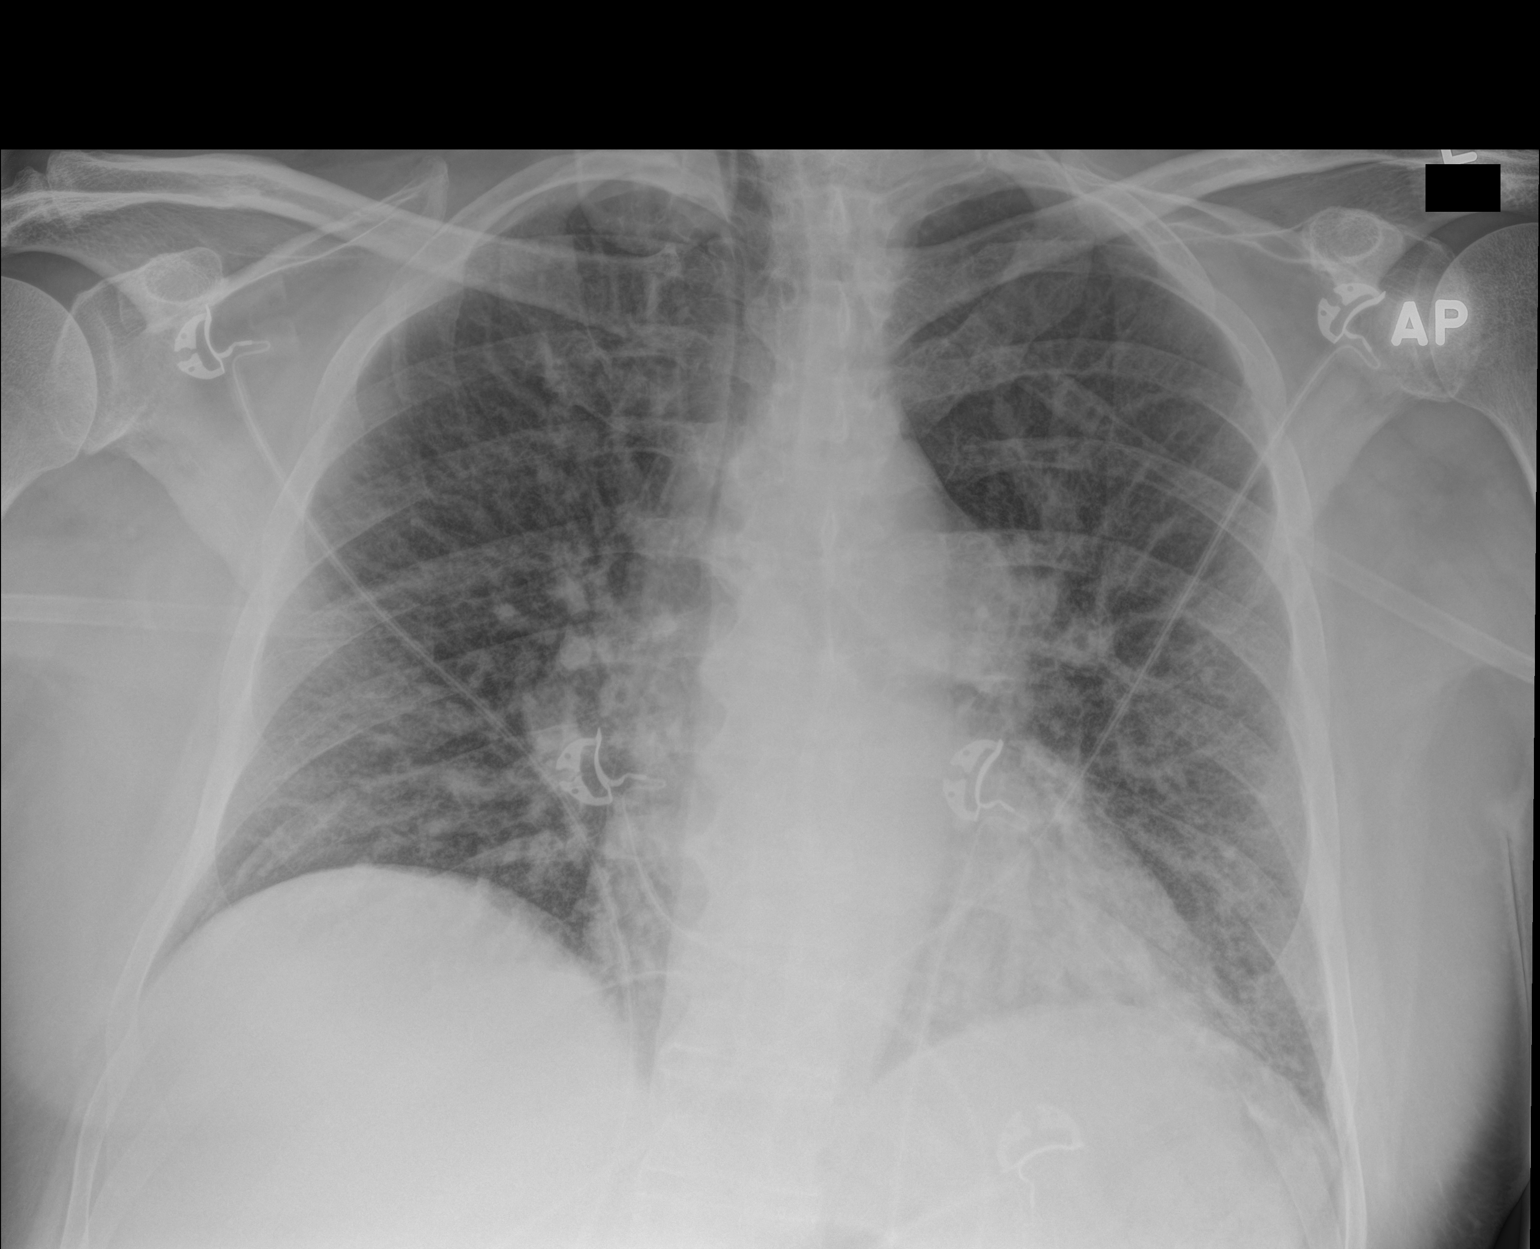

[1 of 1 positions shown; findings below may reference images not displayed]

FINDINGS: Shallow inspiration. Borderline heart size with increased pulmonary
vascularity. No edema or consolidation. No blunting of costophrenic
angles. No pneumothorax. Peribronchial thickening suggesting airways
disease. Mediastinal contours appear intact. Postoperative changes
in the lower cervical spine.
IMPRESSION: Congestive changes with cardiac enlargement and pulmonary vascular
congestion. No definite edema or effusion. Airways thickening
suggesting airways disease.

## 2019-10-16 DEATH — deceased
